# Patient Record
Sex: Female | Born: 1970 | Race: White | Hispanic: No | Marital: Married | Smoking: Current every day smoker
Health system: Southern US, Community
[De-identification: ages and names within clinical notes are randomized; demographics above are authoritative.]

## PROBLEM LIST (undated history)

## (undated) DIAGNOSIS — F419 Anxiety disorder, unspecified: Secondary | ICD-10-CM

## (undated) HISTORY — PX: FIRST RIB REMOVAL: SHX642

## (undated) HISTORY — PX: ABDOMINAL HYSTERECTOMY: SHX81

## (undated) HISTORY — PX: APPENDECTOMY: SHX54

## (undated) HISTORY — PX: TONSILLECTOMY: SUR1361

## (undated) HISTORY — PX: CHOLECYSTECTOMY: SHX55

---

## 2014-01-14 ENCOUNTER — Emergency Department (INDEPENDENT_AMBULATORY_CARE_PROVIDER_SITE_OTHER): Payer: Self-pay

## 2014-01-14 ENCOUNTER — Emergency Department (INDEPENDENT_AMBULATORY_CARE_PROVIDER_SITE_OTHER)
Admission: EM | Admit: 2014-01-14 | Discharge: 2014-01-14 | Disposition: A | Discharge status: Home / Self Care | Payer: Self-pay | Source: Home / Self Care | Attending: Family Medicine | Admitting: Family Medicine

## 2014-01-14 ENCOUNTER — Encounter: Payer: Self-pay | Admitting: *Deleted

## 2014-01-14 DIAGNOSIS — R05 Cough: Secondary | ICD-10-CM

## 2014-01-14 DIAGNOSIS — H6123 Impacted cerumen, bilateral: Secondary | ICD-10-CM

## 2014-01-14 DIAGNOSIS — R0781 Pleurodynia: Secondary | ICD-10-CM

## 2014-01-14 DIAGNOSIS — R042 Hemoptysis: Secondary | ICD-10-CM

## 2014-01-14 DIAGNOSIS — R058 Other specified cough: Secondary | ICD-10-CM

## 2014-01-14 DIAGNOSIS — H8111 Benign paroxysmal vertigo, right ear: Secondary | ICD-10-CM

## 2014-01-14 DIAGNOSIS — H65191 Other acute nonsuppurative otitis media, right ear: Secondary | ICD-10-CM

## 2014-01-14 HISTORY — DX: Anxiety disorder, unspecified: F41.9

## 2014-01-14 LAB — POCT CBC W AUTO DIFF (K'VILLE URGENT CARE)

## 2014-01-14 MED ORDER — GUAIFENESIN-CODEINE 100-10 MG/5ML PO SOLN: ORAL | Status: DC

## 2014-01-14 MED ORDER — MECLIZINE HCL 25 MG PO TABS: ORAL_TABLET | ORAL | Status: DC

## 2014-01-14 MED ORDER — AMOXICILLIN 875 MG PO TABS: 875.0000 mg | ORAL_TABLET | Freq: Two times a day (BID) | ORAL | Status: DC

## 2014-01-14 NOTE — ED Provider Notes (Signed)
CSN: [id number]     Arrival date & time 01/14/14  1310 History   First MD Initiated Contact with Patient 01/14/14 1341     Chief Complaint  Patient presents with  . Hemoptysis  . Chest Pain    posterior rib pain  . Nausea     HPI Comments: Patient states that for the past 6 months she has recurring sinus infections every several weeks.  Approximately two weeks ago she developed recurrent nasal congestion, sore throat, watery eyes, and non-productive cough.  Three days ago she developed pleuritic pain pain in her right lateral chest.  She has had chills.  Her right ear now feels clogged.  Over the past two days she has felt intermittently dizzy (head spinning) with occasional nausea/vomiting.  This morning she coughed up some blood. She is a smoker.    The history is provided by the patient.    Past Medical History  Diagnosis Date  . Anxiety    Past Surgical History  Procedure Laterality Date  . Tonsillectomy    . Abdominal hysterectomy    . Appendectomy    . Cholecystectomy     Family History  Problem Relation Age of Onset  . Diabetes Mother    History  Substance Use Topics  . Smoking status: Current Every Day Smoker -- 0.50 packs/day for 25 years    Types: Cigarettes  . Smokeless tobacco: Never Used  . Alcohol Use: Yes   OB History    No data available     Review of Systems No sore throat + cough + right pleuritic pain + wheezing + nasal congestion + post-nasal drainage + sinus pain/pressure No itchy/red eyes ? earache + hemoptysis + SOB No fever, + chills + nausea + vomiting No abdominal pain No diarrhea No urinary symptoms No skin rash + fatigue No myalgias + headache Used OTC meds without relief  Allergies  Review of patient's allergies indicates no known allergies.  Home Medications   Prior to Admission medications   Medication Sig Start Date End Date Taking? Authorizing Provider  ALPRAZolam Prudy Feeler(XANAX) 0.5 MG tablet Take 0.5 mg by mouth at  bedtime as needed for anxiety.   Yes Historical Provider, MD  amoxicillin (AMOXIL) 875 MG tablet Take 1 tablet (875 mg total) by mouth 2 (two) times daily. 01/14/14   Lattie HawStephen A Jourdan Maldonado, MD  guaiFENesin-codeine 100-10 MG/5ML syrup Take 10mL by mouth at bedtime as needed for cough 01/14/14   Lattie HawStephen A Zayed Griffie, MD  meclizine (ANTIVERT) 25 MG tablet Take one tab by mouth 2 or 3 times daily as needed for dizziness 01/14/14   Lattie HawStephen A Tameca Jerez, MD   BP 132/85 mmHg  Pulse 73  Temp(Src) 97.7 F (36.5 C) (Oral)  Resp 16  Ht 5\' 7"  (1.702 m)  Wt 191 lb (86.637 kg)  BMI 29.91 kg/m2  SpO2 100% Physical Exam  Pulmonary/Chest:     Nursing notes and Vital Signs reviewed. Appearance:  Patient appears stated age, and in no acute distress Eyes:  Pupils are equal, round, and reactive to light and accomodation.  Extraocular movement is intact.  Conjunctivae are not inflamed  Ears:   Right canal partly occluded with cerumen; upper portion of tympanic membrane appears slightly erythematous.  Left external auditory canal is completely occluded with cerumen.  Post left lavage, left canal and tympanic membrane normal Nose:  Congested turbinates.  No sinus tenderness.    Pharynx:  Normal Neck:  Supple.  Slightly tender shotty posterior nodes are palpated  bilaterally  Lungs:  Clear to auscultation.  Breath sounds are equal.  Chest:  Tenderness to palpation posterior right lateral ribs as noted on diagram. Heart:  Regular rate and rhythm without murmurs, rubs, or gallops.  Abdomen:  Nontender without masses or hepatosplenomegaly.  Bowel sounds are present.  No CVA or flank tenderness.  Extremities:  No edema.  No calf tenderness Skin:  No rash present.   ED Course  Procedures none  Labs Reviewed - CBC:  WBC 9.0; LY 39.1; MO 7.3; GR 53.6; Hgb 13.8; Platelets 196   Imaging Review Dg Chest 2 View  01/14/2014   CLINICAL DATA:  43 year old female with right lower posterior lateral rib pain for 3 weeks in the setting  of nonproductive cough and hemoptysis. Initial encounter.  EXAM: CHEST  2 VIEW  COMPARISON:  None.  FINDINGS: Normal lung volumes. Normal cardiac size and mediastinal contours. Visualized tracheal air column is within normal limits. The lungs are clear. No pneumothorax or effusion. Bone mineralization is within normal limits. No acute osseous abnormality identified.  IMPRESSION: Negative, no acute cardiopulmonary abnormality.   Electronically Signed   By: Augusto GambleLee  Hall M.D.   On: 01/14/2014 14:46   Dg Ribs Unilateral Right  01/14/2014   CLINICAL DATA:  43 year old female with right lower posterior lateral rib pain for 3 weeks in the setting of nonproductive cough and hemoptysis. Initial encounter.  EXAM: RIGHT RIBS - 2 VIEW  COMPARISON:  Chest radiographs from today reported separately.  FINDINGS: Cholecystectomy clips in the right upper quadrant. Rib marker placed at the level of the right lateral or anterior eleventh rib. Bone mineralization is within normal limits. Lower ribs appear intact. No displaced rib fracture identified. No acute osseous abnormality identified.  IMPRESSION: No displaced right rib fracture identified.   Electronically Signed   By: Augusto GambleLee  Hall M.D.   On: 01/14/2014 14:47     MDM   1. Cerumen impaction, bilateral   2. Recurrent nonproductive cough; suspect bronchitis  3. Hemoptysis   4. Acute nonsuppurative otitis media of right ear   5. Benign paroxysmal positional vertigo, right      Begin amoxicillin.  Robitussin AC at bedtime. If vertigo persists, may begin prn Antivert. Take plain Mucinex (1200 mg guaifenesin) twice daily for cough and congestion.  May add Sudafed for sinus congestion.   Increase fluid intake, rest. May use Afrin nasal spray (or generic oxymetazoline) twice daily for about 5 days.  Also recommend using saline nasal spray several times daily and saline nasal irrigation (AYR is a common brand) Try warm salt water gargles for sore throat.  Stop all  antihistamines for now, and other non-prescription cough/cold preparations. May take Ibuprofen 200mg , 4 tabs every 8 hours with food for chest/sternum discomfort. Followup with Family Doctor if not improved in 10 days, especially if hemoptysis persists.   Follow-up with family doctor if not improving 7 to 10 days.   Lattie HawStephen A Erasto Sleight, MD 01/14/14 [phone]

## 2014-01-14 NOTE — ED Notes (Signed)
Diana Bradley c/o intermittent sinus infections x 6 months. She has had a minimal cough but this AM coughed up blood. 3 days ago she started having right posterior rib pain with deep breathing. Nausea and vomiting this AM.

## 2014-01-14 NOTE — Discharge Instructions (Signed)
Take plain Mucinex (1200 mg guaifenesin) twice daily for cough and congestion.  May add Sudafed for sinus congestion.   Increase fluid intake, rest. °May use Afrin nasal spray (or generic oxymetazoline) twice daily for about 5 days.  Also recommend using saline nasal spray several times daily and saline nasal irrigation (AYR is a common brand) °Try warm salt water gargles for sore throat.  °Stop all antihistamines for now, and other non-prescription cough/cold preparations. °May take Ibuprofen 200mg, 4 tabs every 8 hours with food for chest/sternum discomfort. °  °Follow-up with family doctor if not improving 7 to 10 days.  °

## 2014-05-15 ENCOUNTER — Encounter (HOSPITAL_BASED_OUTPATIENT_CLINIC_OR_DEPARTMENT_OTHER): Payer: Self-pay | Admitting: *Deleted

## 2014-05-15 ENCOUNTER — Emergency Department (HOSPITAL_BASED_OUTPATIENT_CLINIC_OR_DEPARTMENT_OTHER)
Admission: EM | Admit: 2014-05-15 | Discharge: 2014-05-15 | Discharge status: Against Medical Advice | Payer: Self-pay | Attending: Emergency Medicine | Admitting: Emergency Medicine

## 2014-05-15 DIAGNOSIS — Y998 Other external cause status: Secondary | ICD-10-CM | POA: Insufficient documentation

## 2014-05-15 DIAGNOSIS — Z720 Tobacco use: Secondary | ICD-10-CM | POA: Insufficient documentation

## 2014-05-15 DIAGNOSIS — S4992XA Unspecified injury of left shoulder and upper arm, initial encounter: Secondary | ICD-10-CM | POA: Insufficient documentation

## 2014-05-15 DIAGNOSIS — X58XXXA Exposure to other specified factors, initial encounter: Secondary | ICD-10-CM | POA: Insufficient documentation

## 2014-05-15 DIAGNOSIS — Y93E5 Activity, floor mopping and cleaning: Secondary | ICD-10-CM | POA: Insufficient documentation

## 2014-05-15 DIAGNOSIS — Y92009 Unspecified place in unspecified non-institutional (private) residence as the place of occurrence of the external cause: Secondary | ICD-10-CM | POA: Insufficient documentation

## 2014-05-15 NOTE — ED Notes (Signed)
Unable to find pt in waiting room. 

## 2014-05-15 NOTE — ED Notes (Signed)
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

## 2014-11-24 ENCOUNTER — Emergency Department (INDEPENDENT_AMBULATORY_CARE_PROVIDER_SITE_OTHER): Payer: Self-pay

## 2014-11-24 ENCOUNTER — Emergency Department (INDEPENDENT_AMBULATORY_CARE_PROVIDER_SITE_OTHER)
Admission: EM | Admit: 2014-11-24 | Discharge: 2014-11-24 | Disposition: A | Discharge status: Home / Self Care | Payer: Self-pay | Source: Home / Self Care | Attending: Family Medicine | Admitting: Family Medicine

## 2014-11-24 ENCOUNTER — Encounter: Payer: Self-pay | Admitting: *Deleted

## 2014-11-24 DIAGNOSIS — H9201 Otalgia, right ear: Secondary | ICD-10-CM

## 2014-11-24 DIAGNOSIS — H66006 Acute suppurative otitis media without spontaneous rupture of ear drum, recurrent, bilateral: Secondary | ICD-10-CM

## 2014-11-24 DIAGNOSIS — H7491 Unspecified disorder of right middle ear and mastoid: Secondary | ICD-10-CM

## 2014-11-24 LAB — POCT CBC W AUTO DIFF (K'VILLE URGENT CARE)

## 2014-11-24 MED ORDER — HYDROCODONE-ACETAMINOPHEN 5-325 MG PO TABS: 1.0000 | ORAL_TABLET | Freq: Four times a day (QID) | ORAL | Status: DC | PRN

## 2014-11-24 MED ORDER — AMOXICILLIN-POT CLAVULANATE 875-125 MG PO TABS: 1.0000 | ORAL_TABLET | Freq: Two times a day (BID) | ORAL | Status: DC

## 2014-11-24 MED ORDER — IOHEXOL 300 MG/ML  SOLN
75.0000 mL | Freq: Once | INTRAMUSCULAR | Status: AC | PRN
  Administered 2014-11-24: 75 mL via INTRAVENOUS

## 2014-11-24 NOTE — ED Notes (Signed)
Pt c/o 1 1/2-2 weeks of aches, cough URI. 2 days of severe right ear pain. No drainage. Yesterday right facial swelling and pain into her right shoulder.

## 2014-11-24 NOTE — Discharge Instructions (Signed)
Take plain guaifenesin (  extended release tabs such as Mucinex) twice daily, with plenty of water, for cough and congestion.  May add Pseudoephedrine ( , one or two every 4 to 6 hours) for sinus congestion.  Get adequate rest.   May use Afrin nasal spray (or generic oxymetazoline) twice daily for about 5 days and then discontinue.  Also recommend using saline nasal spray several times daily and saline nasal irrigation (AYR is a common brand).  Use Flonase nasal spray each morning after using Afrin nasal spray and saline nasal irrigation. If symptoms become significantly worse during the night or over the weekend, proceed to the local emergency room.

## 2014-11-24 NOTE — ED Provider Notes (Addendum)
CSN: [id number]     Arrival date & time 11/24/14  1245 History   First MD Initiated Contact with Patient 11/24/14 1336     Chief Complaint  Patient presents with  . Otalgia  . Cough      HPI Comments: Patient reports that she developed a typical cold about 2 weeks ago.  Most symptoms improved after about 5 days except for persistent nasal congestion.  During the past 3 nights she has had fever to 102.  Two days ago she developed bilateral earache.  Yesterday she developed right facial pain and swelling that extended to her right ear, behind her right ear, and right neck.   She has a past history of otitis media as a child.  The history is provided by the patient.    Past Medical History  Diagnosis Date  . Anxiety    Past Surgical History  Procedure Laterality Date  . Tonsillectomy    . Abdominal hysterectomy    . Appendectomy    . Cholecystectomy     Family History  Problem Relation Age of Onset  . Diabetes Mother    Social History  Substance Use Topics  . Smoking status: Current Every Day Smoker -- 0.50 packs/day for 25 years    Types: Cigarettes  . Smokeless tobacco: Never Used  . Alcohol Use: Yes     Comment: monthly   OB History    No data available     Review of Systems No sore throat No cough No pleuritic pain No wheezing + nasal congestion ? post-nasal drainage + sinus pain/pressure No itchy/red eyes + right earache No hemoptysis No SOB + fever, + chills + nausea No vomiting No abdominal pain No diarrhea No urinary symptoms No skin rash + fatigue + myalgias + headache Used OTC meds without relief  Allergies  Tramadol  Home Medications   Prior to Admission medications   Medication Sig Start Date End Date Taking? Authorizing Provider  ALPRAZolam Prudy Feeler) 0.5 MG tablet Take 0.5 mg by mouth at bedtime as needed for anxiety.    Historical Provider, MD  amoxicillin-clavulanate (AUGMENTIN) 875-125 MG per tablet Take 1 tablet by mouth 2 (two)  times daily. Take with food 11/24/14   Lattie Haw, MD  HYDROcodone-acetaminophen (NORCO/VICODIN) 5-325 MG per tablet Take 1 tablet by mouth every 6 (six) hours as needed for moderate pain. 11/24/14   Lattie Haw, MD   Meds Ordered and Administered this Visit  Medications - No data to display  BP 156/80 mmHg  Pulse 73  Temp(Src) 98.3 F (36.8 C) (Oral)  Resp 20  Wt 192 lb (87.091 kg)  SpO2 100% No data found.   Physical Exam Nursing notes and Vital Signs reviewed. Appearance:  Patient appears stated age, and in no acute distress Eyes:  Pupils are equal, round, and reactive to light and accomodation.  Extraocular movement is intact.  Conjunctivae are not inflamed  Ears:  Canals are partially obstructed with cerumen. Tympanic membranes difficult to fully visualize, but poor landmarks noted.  There is mild tenderness to palpation around the right pinna and auditory meatus but no swelling noted.  There is tenderness to palpation over the right tip of mastoid but no swelling or overlying warmth. Nose:  Congested turbinates.    Maxillary sinus tenderness is present.  No facial swelling noted. Pharynx:  Normal Neck:  Supple.  Tender enlarged posterior nodes are palpated on the right. Lungs:  Clear to auscultation.  Breath sounds are equal.  Moving air well. Heart:  Regular rate and rhythm without murmurs, rubs, or gallops.  Abdomen:  Nontender without masses or hepatosplenomegaly.  Bowel sounds are present.  No CVA or flank tenderness.  Extremities:  No edema.  No calf tenderness Skin:  No rash present.   ED Course  Procedures  None    Labs Reviewed -  Tympanogram:  Low peak height both ears ("Flat line"); normal ear volume   POCT CBC:  WBC 10.5; LY 30.1; MO 3.7; GR 66.2; Hgb 13.7; Platelets 231    Imaging Review Ct Head W Wo Contrast  11/24/2014   CLINICAL DATA:  Right mastoid pain  EXAM: CT HEAD WITHOUT AND WITH CONTRAST  TECHNIQUE: Contiguous axial images were obtained  from the base of the skull through the vertex without and with intravenous contrast  CONTRAST:  75mL OMNIPAQUE IOHEXOL 300 MG/ML  SOLN  COMPARISON:  None.  FINDINGS: The anterior frontal lobes and anterior frontal bone were not included on the unenhanced images due to patient positioning. Patient was repositioned for postcontrast imaging however portion of the frontal bone and anterior frontal lobes remains excluded on the postcontrast images. If there are symptoms in this area, repeat scanning could be performed at no additional charge.  Ventricle size normal. Negative for acute or chronic infarction. Benign appearing cyst left basal ganglia. Negative for hemorrhage or mass.  Normal enhancement of the brain postcontrast  Right mastoid sinus effusion with right middle ear effusion. Limited evaluation of the temporal bone given the head CT technique. Left mastoid sinus is clear. Left middle ear is clear. Mild mucosal edema in the paranasal sinuses.  IMPRESSION: The anterior head is excluded on this study. This could be repeated at no additional charge, if requested  No significant intracranial abnormality  Right mastoid sinus and right middle ear effusion.   Electronically Signed   By: Marlan Palau M.D.   On: 11/24/2014 15:47     MDM   1. Recurrent suppurative otitis media of both ears without spontaneous rupture of tympanic membrane, unspecified chronicity  2. Mastoid pain, right.  Concern for possible right mastoiditis, but patient appears non-toxic and WBC is at upper limits of normal   3. Mastoid disorder, right    Discussed with Dr. Hoyt Koch of [facility] Begin Augmentin  BID.  Rx for Lortab Q6hr prn Take plain guaifenesin (  extended release tabs such as Mucinex) twice daily, with plenty of water, for cough and congestion.  May add Pseudoephedrine ( , one or two every 4 to 6 hours) for sinus congestion.  Get adequate rest.   May use Afrin nasal spray (or generic oxymetazoline)  twice daily for about 5 days and then discontinue.  Also recommend using saline nasal spray several times daily and saline nasal irrigation (AYR is a common brand).  Use Flonase nasal spray each morning after using Afrin nasal spray and saline nasal irrigation. If symptoms become significantly worse during the night or over the weekend, proceed to the local emergency room Followup with ENT Dr. Madelynn Done in 4 days.     Lattie Haw, MD 11/25/14 1131  Addendum: Followup call 11/25/14 Patient feels better.  Ears still feel clogged. Will add prednisone burst.  Lattie Haw, MD 11/25/14 [phone]

## 2014-11-24 NOTE — ED Notes (Signed)
Per Dr. Rolla Plate request, apt scheduled with [facility] in [place]. Apt for Audio, then apt with Dr. Carolyne Fiscal scheduled for 11/28/14. 8:15 check-in time. $195 required @ check-in. Pt notified.

## 2014-11-25 MED ORDER — PREDNISONE 20 MG PO TABS: 20.0000 mg | ORAL_TABLET | Freq: Two times a day (BID) | ORAL | Status: DC

## 2016-07-27 IMAGING — CT CT HEAD WO/W CM
3 of 5 series · 16 of 30 positions shown, 18 images · IV contrast (omnipaque)
Comparison: None.

CLINICAL DATA: Right mastoid pain

EXAM:
CT HEAD WITHOUT AND WITH CONTRAST
TECHNIQUE: Contiguous axial images were obtained from the base of the skull
through the vertex without and with intravenous contrast
CONTRAST:  75mL OMNIPAQUE IOHEXOL 300 MG/ML  SOLN

[Series 2: head w/o · axial · non-contrast · 0.49mm/px · z∈[-72,-19]mm · 2 of 32 slices shown]
[im 11/32  brain]
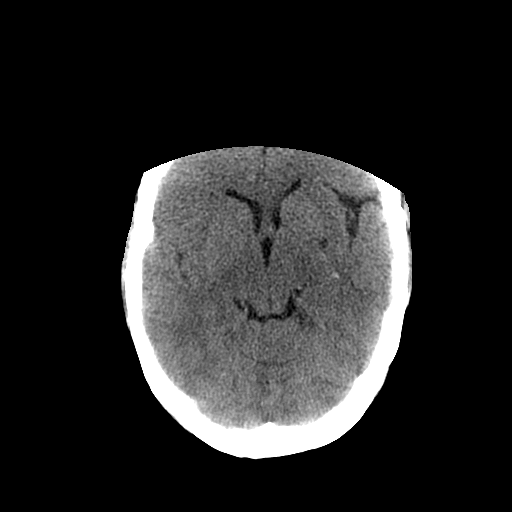
[im 21/32  brain]
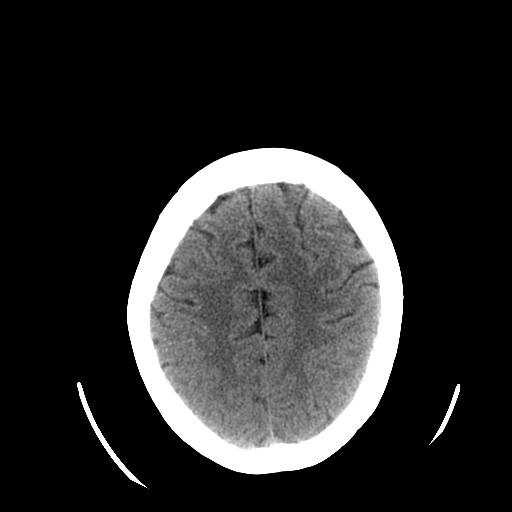

[Series 400: axial · axial · 0.49mm/px · z∈[-102,+12]mm · 7 of 79 slices shown, 9 images]
[im 10/79  brain]
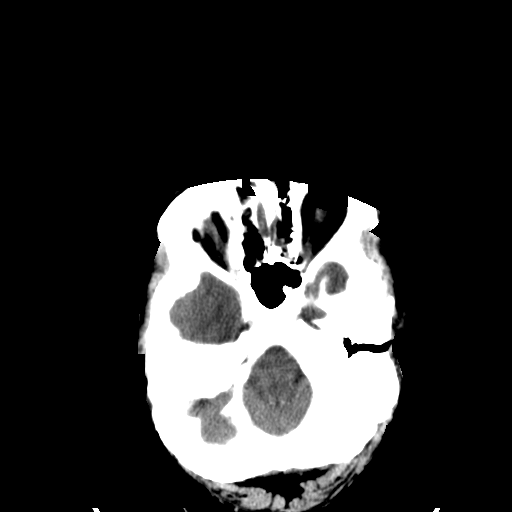
[im 10/79  bone]
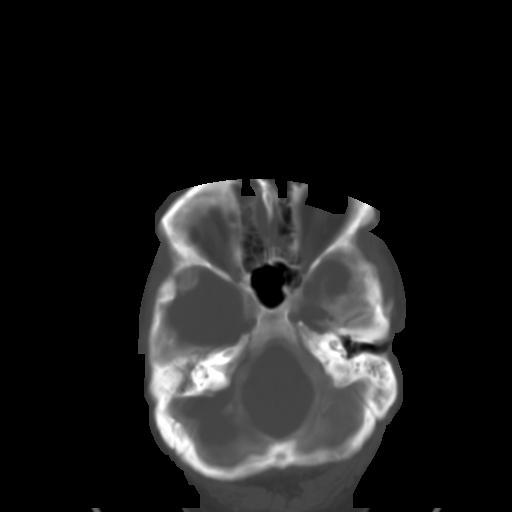
[im 20/79  brain]
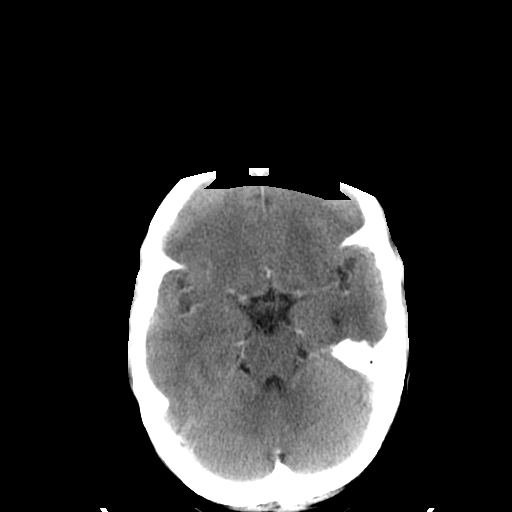
[im 30/79  brain]
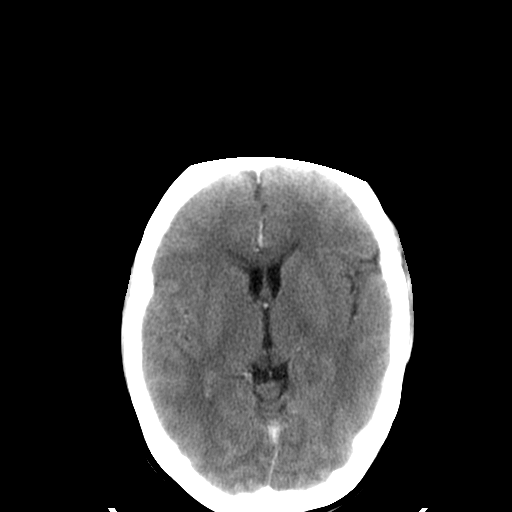
[im 40/79  brain]
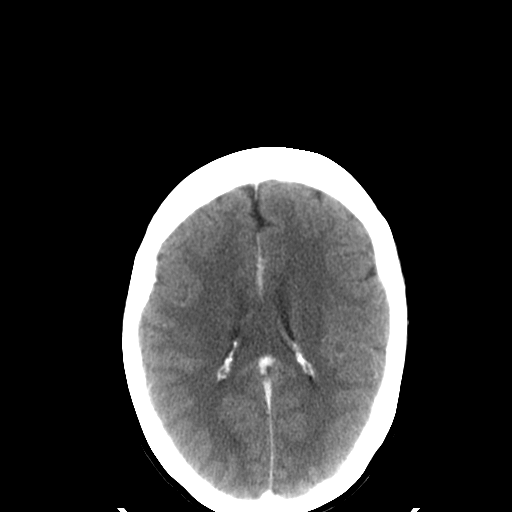
[im 49/79  brain]
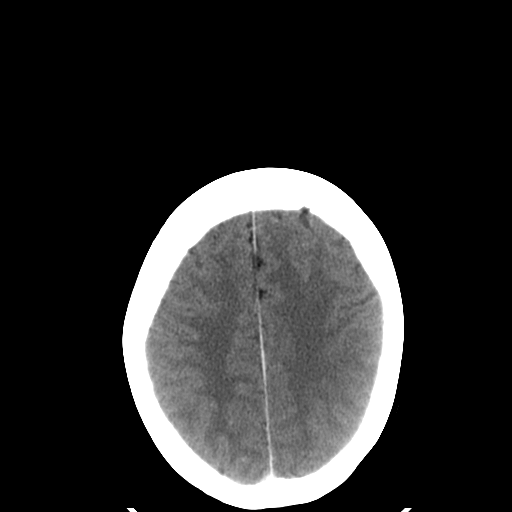
[im 49/79  bone]
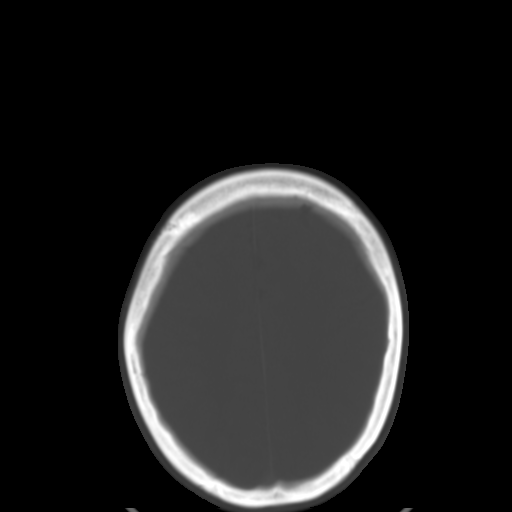
[im 59/79  brain]
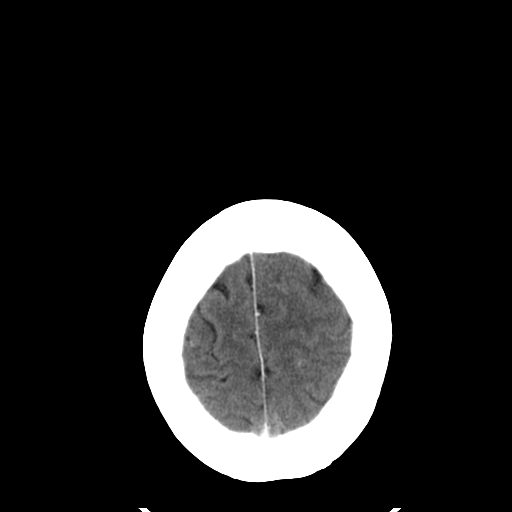
[im 69/79  brain]
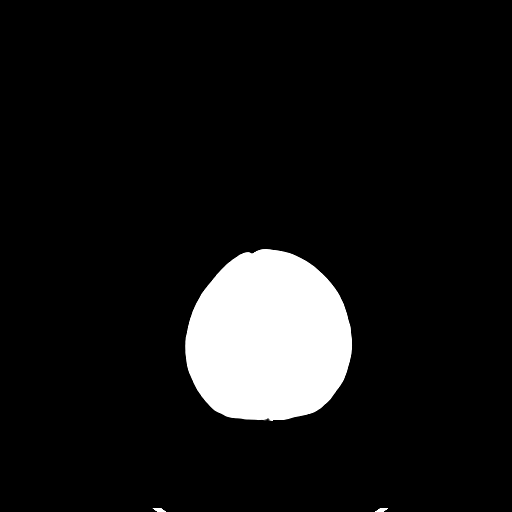

[Series 401: axial soft · axial · 0.49mm/px · z∈[-102,+12]mm · 7 of 79 slices shown]
[im 10/79  brain]
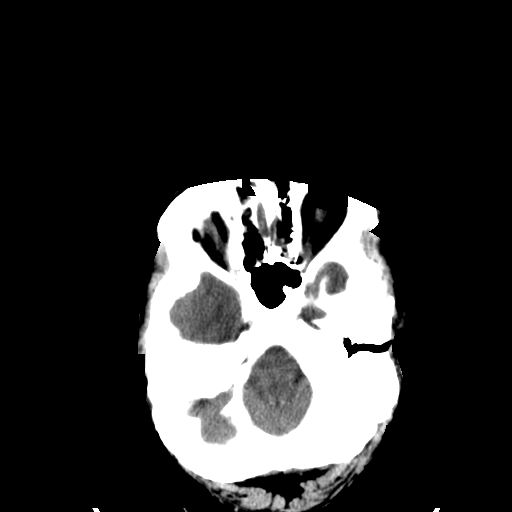
[im 20/79  brain]
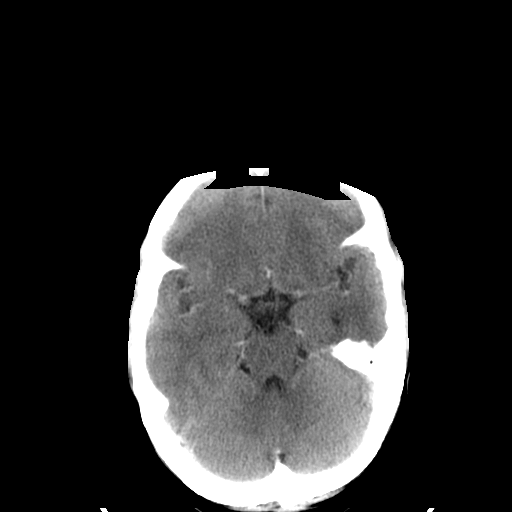
[im 30/79  brain]
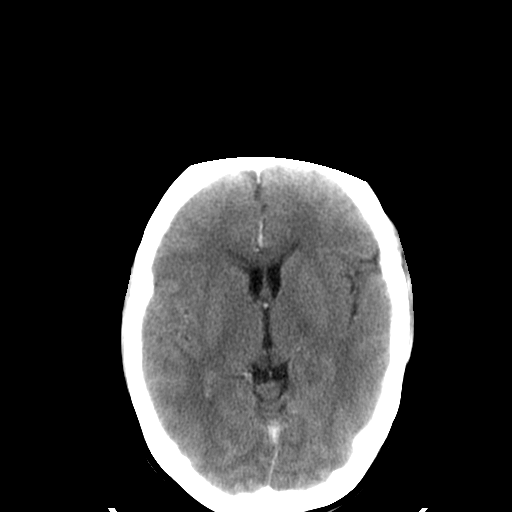
[im 40/79  brain]
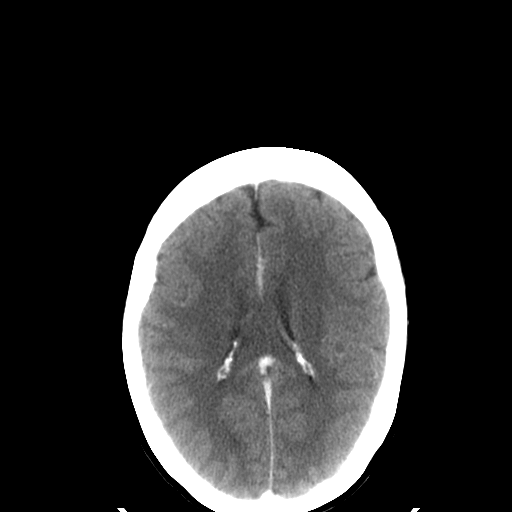
[im 49/79  brain]
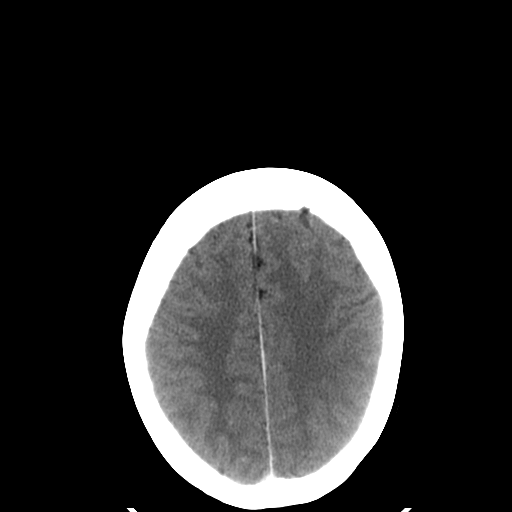
[im 59/79  brain]
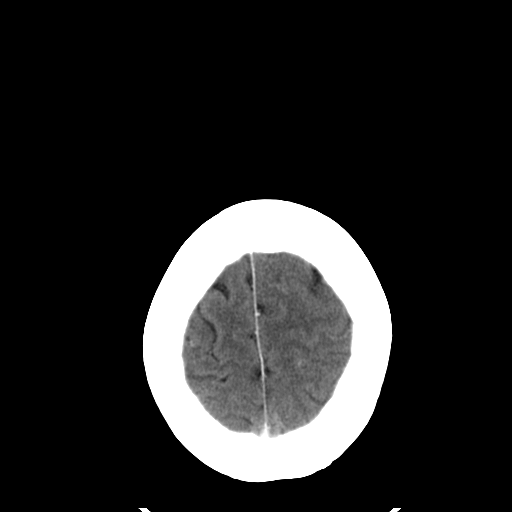
[im 69/79  brain]
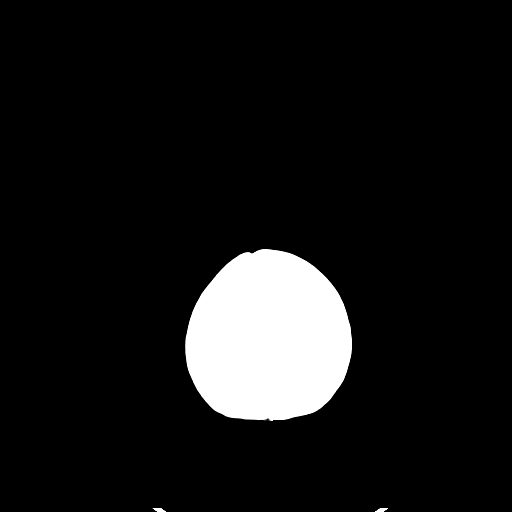

[16 of 30 positions shown; findings below may reference images not displayed]

FINDINGS: The anterior frontal lobes and anterior frontal bone were not
included on the unenhanced images due to patient positioning.
Patient was repositioned for postcontrast imaging however portion of
the frontal bone and anterior frontal lobes remains excluded on the
postcontrast images. If there are symptoms in this area, repeat
scanning could be performed at no additional charge.

Ventricle size normal. Negative for acute or chronic infarction.
Benign appearing cyst left basal ganglia. Negative for hemorrhage or
mass.

Normal enhancement of the brain postcontrast

Right mastoid sinus effusion with right middle ear effusion. Limited
evaluation of the temporal bone given the head CT technique. Left
mastoid sinus is clear. Left middle ear is clear. Mild mucosal edema
in the paranasal sinuses.
IMPRESSION: The anterior head is excluded on this study. This could be repeated
at no additional charge, if requested

No significant intracranial abnormality

Right mastoid sinus and right middle ear effusion.

## 2019-03-13 ENCOUNTER — Emergency Department (INDEPENDENT_AMBULATORY_CARE_PROVIDER_SITE_OTHER): Payer: Self-pay

## 2019-03-13 ENCOUNTER — Other Ambulatory Visit: Payer: Self-pay

## 2019-03-13 ENCOUNTER — Emergency Department (INDEPENDENT_AMBULATORY_CARE_PROVIDER_SITE_OTHER)
Admission: EM | Admit: 2019-03-13 | Discharge: 2019-03-13 | Disposition: A | Discharge status: Home / Self Care | Payer: Self-pay | Source: Home / Self Care

## 2019-03-13 ENCOUNTER — Encounter: Payer: Self-pay | Admitting: Family Medicine

## 2019-03-13 DIAGNOSIS — M79601 Pain in right arm: Secondary | ICD-10-CM

## 2019-03-13 DIAGNOSIS — M7989 Other specified soft tissue disorders: Secondary | ICD-10-CM

## 2019-03-13 DIAGNOSIS — M79631 Pain in right forearm: Secondary | ICD-10-CM

## 2019-03-13 MED ORDER — HYDROCODONE-ACETAMINOPHEN 5-325 MG PO TABS: 1.0000 | ORAL_TABLET | Freq: Four times a day (QID) | ORAL | 0 refills | Status: AC | PRN

## 2019-03-13 NOTE — Discharge Instructions (Addendum)
There is no real sign of infection.  Chest x-ray is normal.  I am concerned that there is something interfering with the venous circulation and for that reason I am asking you to see the vascular specialist, Dr. Edilia Bo, for further evaluation.

## 2019-03-13 NOTE — ED Triage Notes (Signed)
Was seen at [facility] on Dec 28 with cellulitis of the right forearm.  Was on keflex and bactrim for 7 days, got some better.  Pain subsided, but swelling did not resolve.  Started swelling and pain again.

## 2019-03-13 NOTE — ED Provider Notes (Addendum)
[facility]    CSN: [id number] Arrival date & time: 03/13/19  1521      History   Chief Complaint Chief Complaint  Patient presents with  . Arm Pain    HPI Diana Bradley is a 49 y.o. female.   This is the initial [facility] urgent care visit for this 49 year old woman who presents with right arm pain.  She was seen on December 28 with a history of 1 week of pain, and was diagnosed with cellulitis.  She was started on antibiotics.  Patient is a smoker with a history of anxiety problems.  Patient states that her right forearm is very sore and has been bothering her since the early part of December.  She was worked up for cellulitis with a ultrasound, and blood work.  She was started on antibiotics but only had minimal improvement.  Patient has no cough or hoarseness.  She has had no fever.  She has not felt anything in her right breast or her right axilla.  She did have a mammogram several years ago which was normal.  Patient cleans houses for living.  She is a smoker and she is taking estrogen replacement.  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     Past Medical History:  Diagnosis Date  . Anxiety     There are no problems to display for this patient.   Past Surgical History:  Procedure Laterality Date  . ABDOMINAL HYSTERECTOMY    . APPENDECTOMY    . CHOLECYSTECTOMY    . TONSILLECTOMY      OB History   No obstetric history on file.      Home Medications    Prior to Admission medications   Medication Sig Start Date End Date Taking? Authorizing Provider  ALPRAZolam Prudy Feeler) 0.5 MG tablet Take 0.5 mg by mouth at bedtime as needed for anxiety.    [provider]  HYDROcodone-acetaminophen (NORCO/VICODIN) 5-325 MG tablet Take 1 tablet by mouth every 6 (six) hours as needed for moderate pain. 03/13/19   [name], MD    Family History Family History  Problem Relation Age of Onset  .  Diabetes Mother     Social History Social History   Tobacco Use  . Smoking status: Current Every Day Smoker    Packs/day: 0.50    Years: 25.00    Pack years: 12.50    Types: Cigarettes  . Smokeless tobacco: Never Used  Substance Use Topics  . Alcohol use: Yes    Comment: monthly  . Drug use: No     Allergies   Tramadol   Review of Systems Review of Systems  All other systems reviewed and are negative.    Physical Exam Triage Vital Signs ED Triage Vitals  Enc Vitals Group     BP      Pulse      Resp      Temp      Temp src      SpO2      Weight      Height      Head Circumference      Peak Flow      Pain Score      Pain Loc      Pain Edu?      Excl. in GC?    No data found.  Updated Vital Signs BP (!) 151/93 (BP Location: Left Arm)   Pulse 81   Temp 98.6  F (37 C) (Oral)   Resp 20   Ht 5\' 7"  (1.702 m)   Wt 85.3 kg   SpO2 97%   BMI 29.44 kg/m    Physical Exam Vitals and nursing note reviewed.  Constitutional:      General: She is not in acute distress.    Appearance: Normal appearance. She is normal weight. She is not ill-appearing.  HENT:     Head: Normocephalic.  Eyes:     Conjunctiva/sclera: Conjunctivae normal.  Cardiovascular:     Pulses: Normal pulses.  Pulmonary:     Effort: Pulmonary effort is normal.  Musculoskeletal:        General: Swelling and tenderness present. No deformity or signs of injury. Normal range of motion.     Cervical back: Normal range of motion and neck supple.     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  She has mild tenderness diffusely and it is obviously mildly swollen.  There is gray discoloration in the volar wrist on the right, much more so than on the left.  Lymphadenopathy:     Cervical: No cervical adenopathy.  Skin:    General: Skin is warm and dry.  Neurological:     General: No focal deficit present.     Mental Status: She is alert and oriented to person, place,  and time.  Psychiatric:        Mood and Affect: Mood normal.        Behavior: Behavior normal.        Thought Content: Thought content normal.        Judgment: Judgment normal.        UC Treatments / Results  Labs (all labs ordered are listed, but only abnormal results are displayed) Labs Reviewed - No data to display  EKG   Radiology DG Chest 2 View  Result Date: 03/13/2019 CLINICAL DATA:  49 year old female with right forearm swelling. EXAM: CHEST - 2 VIEW COMPARISON:  Chest radiograph dated 01/14/2014. FINDINGS: The heart size and mediastinal contours are within normal limits. Both lungs are clear. The visualized skeletal structures are unremarkable. IMPRESSION: No active cardiopulmonary disease. Electronically Signed   By: Anner Crete M.D.   On: 03/13/2019 16:18    Procedures Procedures (including critical care time)  Medications Ordered in UC Medications - No data to display  Initial Impression / Assessment and Plan / UC Course  I have reviewed the triage vital signs and the nursing notes.  Pertinent labs & imaging results that were available during my care of the patient were reviewed by me and considered in my medical decision making (see chart for details).    Final Clinical Impressions(s) / UC Diagnoses   Final diagnoses:  Pain and swelling of forearm, right     Discharge Instructions     There is no real sign of infection.  Chest x-ray is normal.  I am concerned that there is something interfering with the venous circulation and for that reason I am asking you to see the vascular specialist, Dr. Scot Dock, for further evaluation.    ED Prescriptions    Medication Sig Dispense Auth. Provider   HYDROcodone-acetaminophen (NORCO/VICODIN) 5-325 MG tablet Take 1 tablet by mouth every 6 (six) hours as needed for moderate pain. 25 tablet Robyn Haber, MD     I have reviewed the PDMP during this encounter.   Robyn Haber, MD 03/13/19 1631      Robyn Haber, MD 03/13/19 [phone]

## 2019-04-17 ENCOUNTER — Encounter: Payer: Self-pay | Admitting: Vascular Surgery

## 2019-04-17 ENCOUNTER — Other Ambulatory Visit: Payer: Self-pay

## 2019-04-17 ENCOUNTER — Ambulatory Visit (INDEPENDENT_AMBULATORY_CARE_PROVIDER_SITE_OTHER): Payer: Self-pay | Admitting: Vascular Surgery

## 2019-04-17 ENCOUNTER — Ambulatory Visit (HOSPITAL_COMMUNITY)
Admission: RE | Admit: 2019-04-17 | Discharge: 2019-04-17 | Disposition: A | Discharge status: Home / Self Care | Payer: Self-pay | Source: Ambulatory Visit | Attending: Surgery | Admitting: Surgery

## 2019-04-17 VITALS — BP 145/77 | HR 74 | Temp 98.0°F | Resp 20 | Ht 67.0 in | Wt 190.0 lb

## 2019-04-17 DIAGNOSIS — R2231 Localized swelling, mass and lump, right upper limb: Secondary | ICD-10-CM

## 2019-04-17 DIAGNOSIS — M7989 Other specified soft tissue disorders: Secondary | ICD-10-CM

## 2019-04-17 NOTE — Progress Notes (Signed)
REASON FOR CONSULT:    Painful right upper extremity.  The consult is requested by Dr. [name].   ASSESSMENT & PLAN:   RIGHT ARM PAIN AND SWELLING: Given her history, I suspect that this patient had developed some transient ischemia of the right upper extremity from sleeping on this 20.  When she awoke she had paresthesias in the arm and then likely developed a reperfusion injury with possibly some mild compartment syndrome.  I think her history and symptoms fit with this scenario.  Fortunately her studies today show that she has no evidence of venous outflow obstruction or DVT.  Likewise she has normal arterial signals in the right upper extremity.  I have encouraged her to elevate her arm above her heart.  She can continue to gradually exercise her arm and stretch her forearm.  I have also written her for a forearm compression sleeve with a mild gradient if this helps with her swelling.  However I have reassured her that she has no evidence of significant arterial or venous disease and that this should gradually continue to improve with time.  Waverly Ferrari, MD Office: [phone]   HPI:   Diana Bradley is a pleasant 49 y.o. female, who was referred with pain in the right upper extremity and swelling.  I have reviewed the records from the referring office.  The patient was seen on 03/13/2019 in an urgent care center with right arm pain.  This has been going on for a week and she had been diagnosed with cellulitis and placed on antibiotics.  Her right forearm has been bothering her since the early part of December.  Her work-up included a chest x-ray which was unremarkable.  Given the swelling it was felt that this may be vascular in origin and she was sent for vascular consultation.  On my history in early December the patient awoke 1 morning with her arm hanging off the edge of the bed and had significant paresthesias and numbness in the entire right upper extremity.  She then  repositioned herself and the circulation returned.  It was after this that she developed significant right forearm pain and swelling.  Based on this history I suspect she had ischemia of the right upper extremity and then a reperfusion injury with mild compartment syndrome.  She has no trauma to the arm and no injury to be a source of infection.  She has no previous history of DVT although states that in the past she had a pulmonary embolus.  Risk factors for peripheral vascular disease include tobacco use.  She smokes half a pack to 1 pack/day and has been smoking for 30 years.  She denies any history of diabetes, hypertension, hypercholesterolemia, or family history of premature cardiovascular disease.  Past Medical History:  Diagnosis Date  . Anxiety     Family History  Problem Relation Age of Onset  . Diabetes Mother     SOCIAL HISTORY: Social History   Socioeconomic History  . Marital status: Married    Spouse name: Not on file  . Number of children: Not on file  . Years of education: Not on file  . Highest education level: Not on file  Occupational History  . Not on file  Tobacco Use  . Smoking status: Current Every Day Smoker    Packs/day: 0.50    Years: 25.00    Pack years: 12.50    Types: Cigarettes  . Smokeless tobacco: Never Used  Substance and Sexual Activity  .  Alcohol use: Yes    Comment: occasional  . Drug use: No  . Sexual activity: Not on file  Other Topics Concern  . Not on file  Social History Narrative  . Not on file   Social Determinants of Health   Financial Resource Strain:   . Difficulty of Paying Living Expenses: Not on file  Food Insecurity:   . Worried About Programme researcher, broadcasting/film/video in the Last Year: Not on file  . Ran Out of Food in the Last Year: Not on file  Transportation Needs:   . Lack of Transportation (Medical): Not on file  . Lack of Transportation (Non-Medical): Not on file  Physical Activity:   . Days of Exercise per Week: Not on  file  . Minutes of Exercise per Session: Not on file  Stress:   . Feeling of Stress : Not on file  Social Connections:   . Frequency of Communication with Friends and Family: Not on file  . Frequency of Social Gatherings with Friends and Family: Not on file  . Attends Religious Services: Not on file  . Active Member of Clubs or Organizations: Not on file  . Attends Banker Meetings: Not on file  . Marital Status: Not on file  Intimate Partner Violence:   . Fear of Current or Ex-Partner: Not on file  . Emotionally Abused: Not on file  . Physically Abused: Not on file  . Sexually Abused: Not on file    Allergies  Allergen Reactions  . Tramadol Palpitations and Other (See Comments)    Redness across chest    Current Outpatient Medications  Medication Sig Dispense Refill  . HYDROcodone-acetaminophen (NORCO/VICODIN) 5-325 MG tablet Take 1 tablet by mouth every 6 (six) hours as needed for moderate pain. 25 tablet 0   No current facility-administered medications for this visit.    REVIEW OF SYSTEMS:  [X]  denotes positive finding, [ ]  denotes negative finding Cardiac  Comments:  Chest pain or chest pressure:    Shortness of breath upon exertion: x   Short of breath when lying flat: x   Irregular heart rhythm:        Vascular    Pain in calf, thigh, or hip brought on by ambulation:    Pain in feet at night that wakes you up from your sleep:     Blood clot in your veins:    Leg swelling:         Pulmonary    Oxygen at home:    Productive cough:     Wheezing:         Neurologic    Sudden weakness in arms or legs:  x   Sudden numbness in arms or legs:  x   Sudden onset of difficulty speaking or slurred speech:    Temporary loss of vision in one eye:     Problems with dizziness:         Gastrointestinal    Blood in stool:     Vomited blood:         Genitourinary    Burning when urinating:     Blood in urine:        Psychiatric    Major depression:           Hematologic    Bleeding problems:    Problems with blood clotting too easily:        Skin    Rashes or ulcers:        Constitutional  Fever or chills:     PHYSICAL EXAM:   Vitals:   04/17/19 1510  BP: (!) 145/77  Pulse: 74  Resp: 20  Temp: 98 F (36.7 C)  SpO2: 96%  Weight: 190 lb (86.2 kg)  Height: 5\' 7"  (1.702 m)    GENERAL: The patient is a well-nourished female, in no acute distress. The vital signs are documented above. CARDIAC: There is a regular rate and rhythm.  VASCULAR: I do not detect carotid bruits or supraclavicular bruits. She has palpable brachial and radial pulses bilaterally. Currently she does not have any significant right forearm swelling. She has palpable posterior tibial pulses bilaterally. She has no significant lower extremity swelling. PULMONARY: There is good air exchange bilaterally without wheezing or rales. ABDOMEN: Soft and non-tender with normal pitched bowel sounds.  MUSCULOSKELETAL: There are no major deformities or cyanosis. NEUROLOGIC: No focal weakness or paresthesias are detected. SKIN: There are no ulcers or rashes noted. PSYCHIATRIC: The patient has a normal affect.  DATA:    VENOUS DUPLEX: I have independently interpreted her venous duplex scan today.  There is no evidence of DVT in the right upper extremity.  Of note she was noted to have triphasic arterial waveforms in the brachial, radial, and ulnar positions.

## 2020-11-13 IMAGING — DX DG CHEST 2V
2 series · 2 of 2 positions shown · non-contrast
Comparison: Chest radiograph dated 01/14/2014.

CLINICAL DATA: 48-year-old female with right forearm swelling.

EXAM:
CHEST - 2 VIEW

[chest pa]
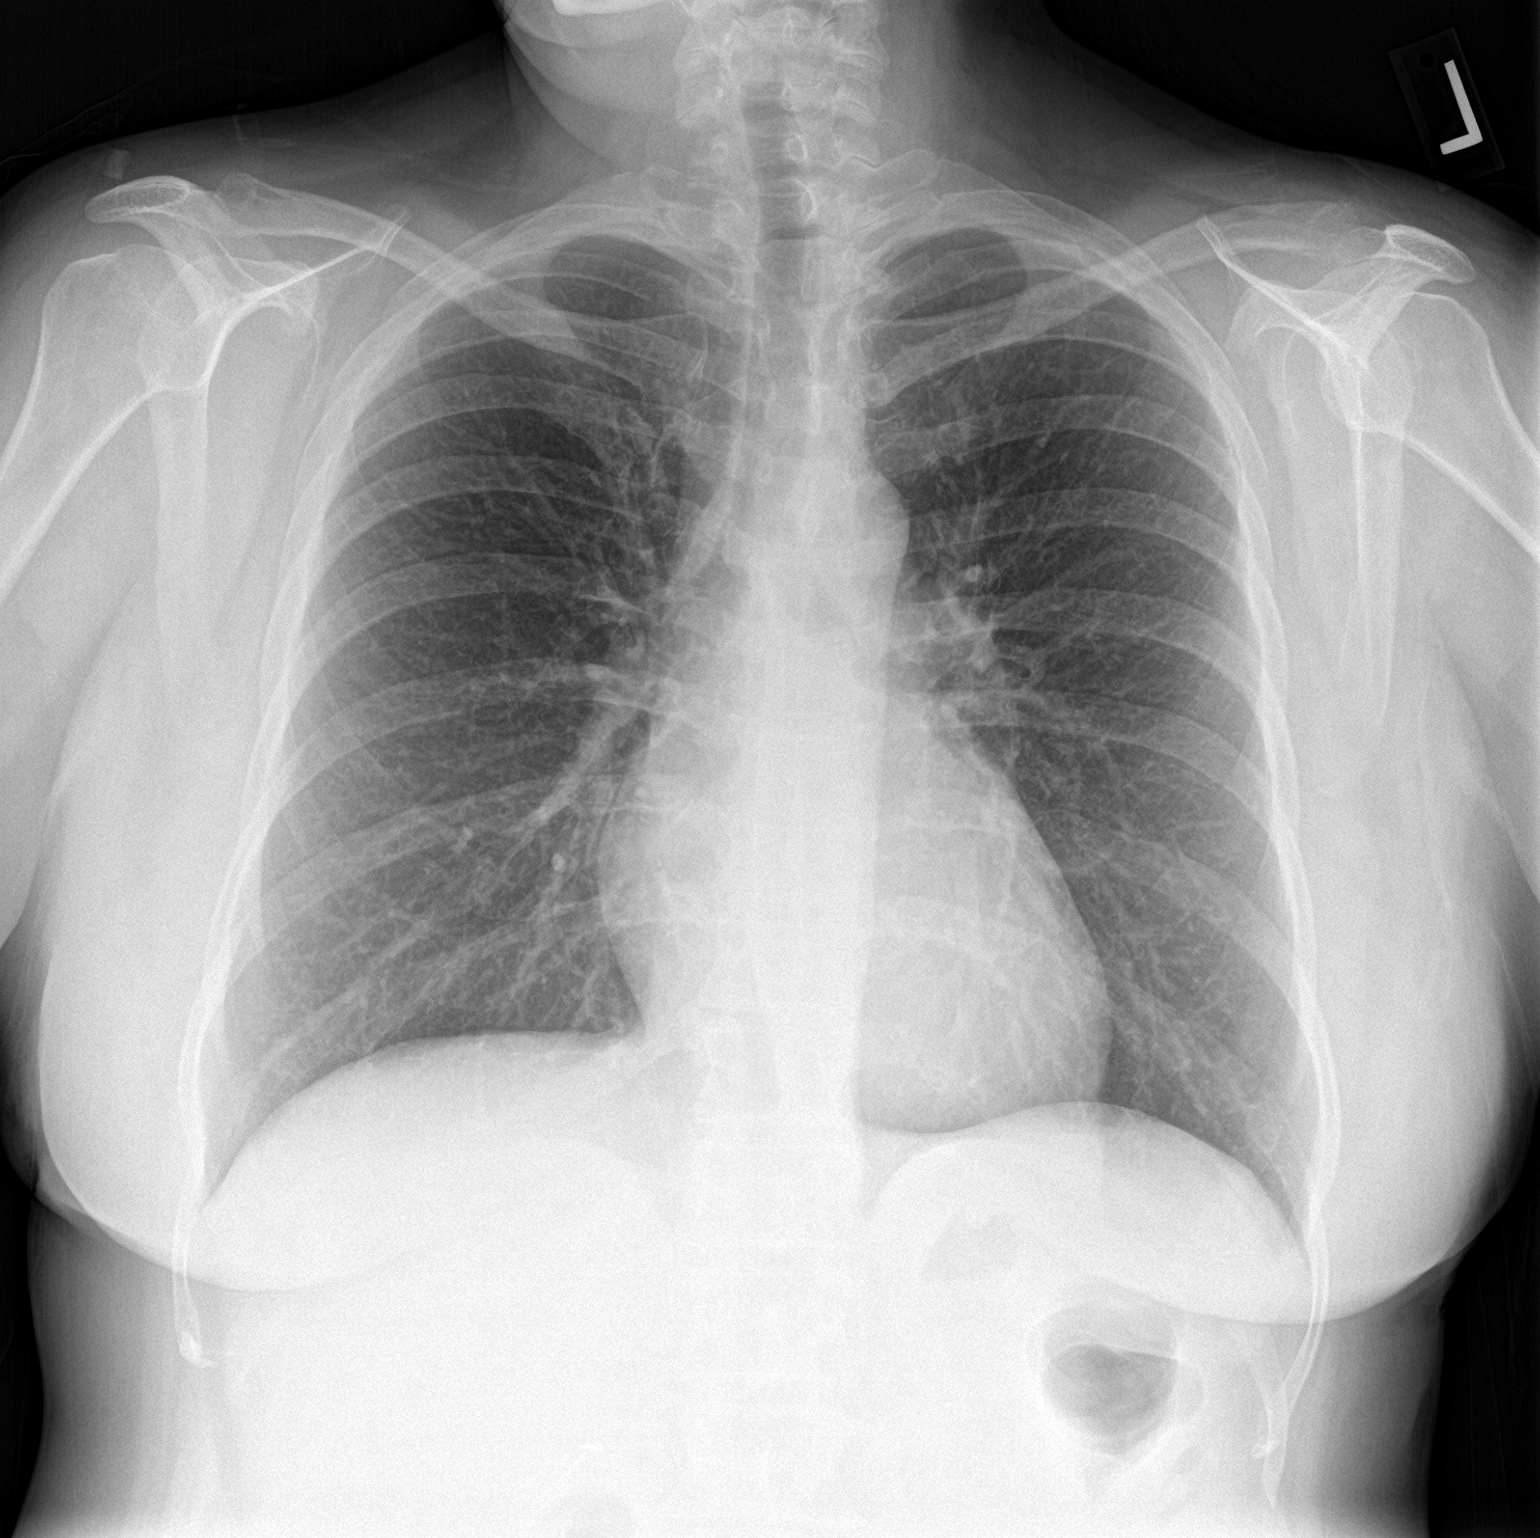

[chest lat]
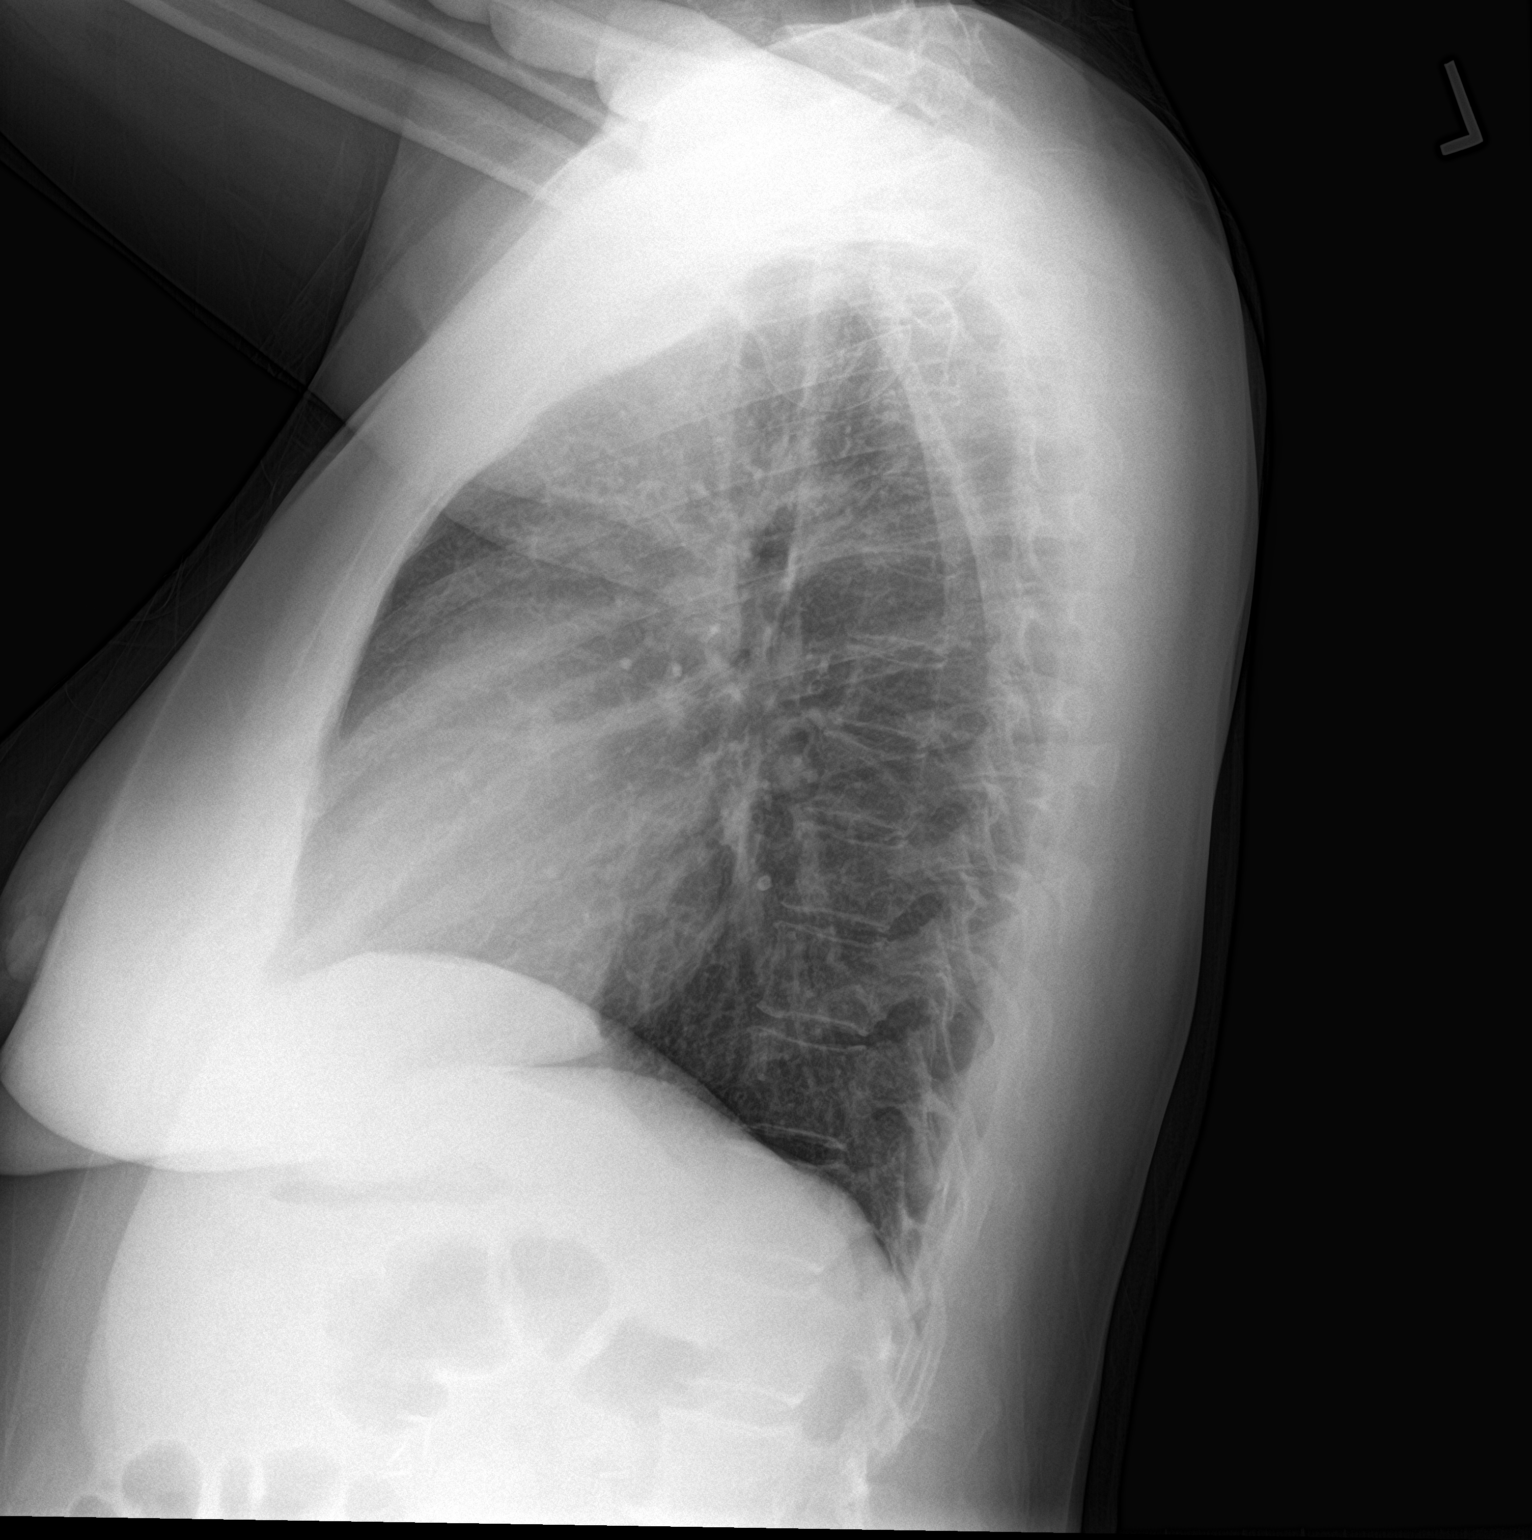

[2 of 2 positions shown; findings below may reference images not displayed]

FINDINGS: The heart size and mediastinal contours are within normal limits.
Both lungs are clear. The visualized skeletal structures are
unremarkable.
IMPRESSION: No active cardiopulmonary disease.
